# Patient Record
Sex: Male | Born: 1985 | Race: Black or African American | Hispanic: No | Marital: Single | Smoking: Never smoker
Health system: Southern US, Community
[De-identification: ages and names within clinical notes are randomized; demographics above are authoritative.]

---

## 2020-04-22 ENCOUNTER — Encounter (HOSPITAL_COMMUNITY): Payer: Self-pay

## 2020-04-22 ENCOUNTER — Other Ambulatory Visit: Payer: Self-pay

## 2020-04-22 ENCOUNTER — Ambulatory Visit (HOSPITAL_COMMUNITY)
Admission: EM | Admit: 2020-04-22 | Discharge: 2020-04-22 | Disposition: A | Discharge status: Home / Self Care | Payer: Self-pay | Attending: Family Medicine | Admitting: Family Medicine

## 2020-04-22 ENCOUNTER — Emergency Department (HOSPITAL_COMMUNITY): Payer: Self-pay

## 2020-04-22 ENCOUNTER — Emergency Department (HOSPITAL_COMMUNITY)
Admission: EM | Admit: 2020-04-22 | Discharge: 2020-04-22 | Disposition: A | Discharge status: Home / Self Care | Payer: Self-pay | Attending: Emergency Medicine | Admitting: Emergency Medicine

## 2020-04-22 DIAGNOSIS — R519 Headache, unspecified: Secondary | ICD-10-CM

## 2020-04-22 LAB — CBC WITH DIFFERENTIAL/PLATELET
Abs Immature Granulocytes: 0.02 10*3/uL (ref 0.00–0.07)
Basophils Absolute: 0 10*3/uL (ref 0.0–0.1)
Basophils Relative: 1 %
Eosinophils Absolute: 0.4 10*3/uL (ref 0.0–0.5)
Eosinophils Relative: 8 %
HCT: 46.3 % (ref 39.0–52.0)
Hemoglobin: 14.8 g/dL (ref 13.0–17.0)
Immature Granulocytes: 0 %
Lymphocytes Relative: 44 %
Lymphs Abs: 2.2 10*3/uL (ref 0.7–4.0)
MCH: 28.5 pg (ref 26.0–34.0)
MCHC: 32 g/dL (ref 30.0–36.0)
MCV: 89.2 fL (ref 80.0–100.0)
Monocytes Absolute: 0.4 10*3/uL (ref 0.1–1.0)
Monocytes Relative: 8 %
Neutro Abs: 2 10*3/uL (ref 1.7–7.7)
Neutrophils Relative %: 39 %
Platelets: 203 10*3/uL (ref 150–400)
RBC: 5.19 MIL/uL (ref 4.22–5.81)
RDW: 12.5 % (ref 11.5–15.5)
WBC: 5 10*3/uL (ref 4.0–10.5)
nRBC: 0 % (ref 0.0–0.2)

## 2020-04-22 LAB — COMPREHENSIVE METABOLIC PANEL
ALT: 20 U/L (ref 0–44)
AST: 20 U/L (ref 15–41)
Albumin: 3.9 g/dL (ref 3.5–5.0)
Alkaline Phosphatase: 77 U/L (ref 38–126)
Anion gap: 8 (ref 5–15)
BUN: 8 mg/dL (ref 6–20)
CO2: 27 mmol/L (ref 22–32)
Calcium: 9.3 mg/dL (ref 8.9–10.3)
Chloride: 104 mmol/L (ref 98–111)
Creatinine, Ser: 1.2 mg/dL (ref 0.61–1.24)
GFR, Estimated: >60 mL/min (ref >60)
Glucose, Bld: 96 mg/dL (ref 70–99)
Potassium: 4.2 mmol/L (ref 3.5–5.1)
Sodium: 139 mmol/L (ref 135–145)
Total Bilirubin: 1 mg/dL (ref 0.3–1.2)
Total Protein: 6.9 g/dL (ref 6.5–8.1)

## 2020-04-22 MED ORDER — GADOBUTROL 1 MMOL/ML IV SOLN
10.0000 mL | Freq: Once | INTRAVENOUS | Status: AC | PRN
  Administered 2020-04-22: 10 mL via INTRAVENOUS

## 2020-04-22 MED ORDER — DIPHENHYDRAMINE HCL 50 MG/ML IJ SOLN
25.0000 mg | Freq: Once | INTRAMUSCULAR | Status: AC
  Administered 2020-04-22: 25 mg via INTRAVENOUS
  Filled 2020-04-22: qty 1

## 2020-04-22 MED ORDER — PROCHLORPERAZINE EDISYLATE 10 MG/2ML IJ SOLN
10.0000 mg | Freq: Once | INTRAMUSCULAR | Status: AC
  Administered 2020-04-22: 10 mg via INTRAVENOUS
  Filled 2020-04-22: qty 2

## 2020-04-22 MED ORDER — SODIUM CHLORIDE 0.9 % IV BOLUS
1000.0000 mL | Freq: Once | INTRAVENOUS | Status: AC
  Administered 2020-04-22: 1000 mL via INTRAVENOUS

## 2020-04-22 MED ORDER — PREDNISONE 20 MG PO TABS: 40.0000 mg | ORAL_TABLET | Freq: Every day | ORAL | 0 refills | Status: AC

## 2020-04-22 NOTE — ED Triage Notes (Signed)
Pt c/o HA for approx 3 weeks that originated on left temporal area and progressed to involve right temporal area as well. Reports sensitivity to noise as well. Pt states the HA has improved the past week that he attributes to increased opportunity for sleep/rest Last took ibuprofen yesterday with some improvement of symptoms.   States he felt his left side was slightly "weaker" than right approx 3 weeks ago at onset of HA.  Gross neuro intact, smile symmetrical, grips equal/strong, negative for arm drift.  Denies dizziness, CP,  extremity weakness/parassthesias, slurred speech, change in vision, n/v, sensitivity to light, neck pain/rigidity, fever, chills, congestion or other URI, ear pain.   States he had similar prolonged HA in February that pt attributed long hours of 160 hours/week and loss of sleep. Reports he is here today b/c his girlfriend insisted pt be evaluated 2/2 her family h/o neuro/brain dz.

## 2020-04-22 NOTE — ED Notes (Signed)
MRI called, transport on way to bring to MRI

## 2020-04-22 NOTE — ED Provider Notes (Signed)
[facility] EMERGENCY DEPARTMENT Provider Note   CSN: [id number] Arrival date & time: 04/22/20  1313     History Chief Complaint  Patient presents with  . Headache    Kenneth Silva is a 34 y.o. male who presents for evaluation of headache that has been ongoing for about 3 weeks.  He states that he noticed a headache about 3 weeks ago.  He states that he was leaving his girlfriend's house and getting ready to go to work when it started.  He states it has been on the left.  He states that since then, it has been intermittently occurring.  He feels like it gets better when he rests and goes to sleep and then when he starts moving around again, he feels like the headache starts.  He states when it gets worse, it radiates from the left to the right side.  He states that he will take ibuprofen and it somewhat dulls it but then it comes back.  He describes it as a throbbing headache.  He states he is sensitive to sounds but does not have any photophobia.  He has not noticed any associated blurry vision.  He denies any preceding trauma, injury.  He is not on blood thinners.  He does state that he feels like he has had some weakness and decrease sensation noted to his entire left side.  He describes it as "feeling like he's losing steam." He states that he feels like this is been ongoing for some time but has difficulty bearing down exactly when this has occurred.  He states that he can feel his left side but he states that when things touch it, it feels different.  He has no family history of MS or neuro abnormalities.  He states he had some similar episodes of headache in January which he states improved after he got rest and started sleeping well.  He went to urgent care today for evaluation of symptoms and was brought to the emergency department.    The history is provided by the patient.       No past medical history on file.  There are no problems to display for this  patient.   No past surgical history on file.     Family History  Problem Relation Age of Onset  . Healthy Mother     Social History   Tobacco Use  . Smoking status: Never Smoker  . Smokeless tobacco: Never Used  Vaping Use  . Vaping Use: Never used  Substance Use Topics  . Alcohol use: Never  . Drug use: Never    Home Medications Prior to Admission medications   Medication Sig Start Date End Date Taking? Authorizing Provider  ibuprofen (ADVIL) 200 MG tablet Take 400 mg by mouth 2 (two) times daily as needed for headache.   Yes [provider]  Tetrahydrozoline HCl (VISINE OP) Place 1 drop into both eyes daily as needed (prior to driving).   Yes [provider]  predniSONE (DELTASONE) 20 MG tablet Take 2 tablets (40 mg total) by mouth daily for 4 days. 04/22/20 04/26/20  Maxwell Caul, PA-C    Allergies    Patient has no known allergies.  Review of Systems   Review of Systems  Constitutional: Negative for fever.  Eyes: Negative for visual disturbance.  Respiratory: Negative for cough and shortness of breath.   Cardiovascular: Negative for chest pain.  Gastrointestinal: Negative for abdominal pain, nausea and vomiting.  Genitourinary: Negative  for dysuria and hematuria.  Neurological: Positive for weakness, numbness and headaches.  All other systems reviewed and are negative.   Physical Exam Updated Vital Signs BP 119/77 (BP Location: Right Arm)   Pulse 80   Temp 98 F (36.7 C) (Oral)   Resp 18   Ht 5\' 7"  (1.702 m)   Wt 94.3 kg   SpO2 100%   BMI 32.58 kg/m   Physical Exam Vitals and nursing note reviewed.  Constitutional:      Appearance: Normal appearance. He is well-developed.  HENT:     Head: Normocephalic and atraumatic.     Comments: No tenderness to palpation of skull. No deformities or crepitus noted. No open wounds, abrasions or lacerations.  Eyes:     General: Lids are normal.     Conjunctiva/sclera: Conjunctivae normal.      Pupils: Pupils are equal, round, and reactive to light.     Comments: PERRL. EOMs intact. No nystagmus. No neglect.   Neck:     Comments: Full flexion/extension and lateral movement of neck fully intact. No bony midline tenderness. No deformities or crepitus.  No rigidity, supple.  Cardiovascular:     Rate and Rhythm: Normal rate and regular rhythm.     Pulses: Normal pulses.          Radial pulses are 2+ on the right side and 2+ on the left side.       Dorsalis pedis pulses are 2+ on the right side and 2+ on the left side.     Heart sounds: Normal heart sounds. No murmur heard.  No friction rub. No gallop.   Pulmonary:     Effort: Pulmonary effort is normal.     Breath sounds: Normal breath sounds.     Comments: Lungs clear to auscultation bilaterally.  Symmetric chest rise.  No wheezing, rales, rhonchi. Abdominal:     Palpations: Abdomen is soft. Abdomen is not rigid.     Tenderness: There is no abdominal tenderness. There is no guarding.     Comments: Abdomen is soft, non-distended, non-tender. No rigidity, No guarding. No peritoneal signs.  Musculoskeletal:        General: Normal range of motion.     Cervical back: Full passive range of motion without pain.  Skin:    General: Skin is warm and dry.     Capillary Refill: Capillary refill takes less than 2 seconds.  Neurological:     Mental Status: He is alert and oriented to person, place, and time.     Comments: Cranial nerves III-XII intact Follows commands, Moves all extremities  5/5 strength to BUE and BLE  He reports decreased sensation noted to the V1 and V3 distribution of the left face as well as LUE and LLE.  Normal finger to nose. No dysdiadochokinesia. On evaluation of drift, he does have some left sided drift but does not pronate  No gait abnormalities  No slurred speech. No facial droop.   Psychiatric:        Speech: Speech normal.     ED Results / Procedures / Treatments   Labs (all labs ordered are  listed, but only abnormal results are displayed) Labs Reviewed  CBC WITH DIFFERENTIAL/PLATELET  COMPREHENSIVE METABOLIC PANEL    EKG None  Radiology CT Head Wo Contrast  Result Date: 04/22/2020 CLINICAL DATA:  Headache EXAM: CT HEAD WITHOUT CONTRAST TECHNIQUE: Contiguous axial images were obtained from the base of the skull through the vertex without intravenous contrast. COMPARISON:  None. FINDINGS: Brain: No evidence of acute infarction, hemorrhage, hydrocephalus, extra-axial collection or mass lesion/mass effect. Vascular: No hyperdense vessel or unexpected calcification. Skull: Normal. Negative for fracture or focal lesion. Sinuses/Orbits: No acute finding. Other: None. IMPRESSION: No acute intracranial abnormality. Electronically Signed   By: Meda KlinefelterStephanie  Peacock MD   On: 04/22/2020 17:50   MR Brain W and Wo Contrast  Result Date: 04/22/2020 CLINICAL DATA:  Headache for 3 weeks. Neuro deficit, acute, stroke suspected. Paresthesias. EXAM: MRI HEAD WITHOUT AND WITH CONTRAST TECHNIQUE: Multiplanar, multiecho pulse sequences of the brain and surrounding structures were obtained without and with intravenous contrast. CONTRAST:  10 mL Gadavist COMPARISON:  CT head without contrast 04/22/2020 FINDINGS: Brain: No acute infarct, hemorrhage, or mass lesion is present. No significant white matter lesions are present. The ventricles are of normal size. No significant extraaxial fluid collection is present. The brainstem and cerebellum are within normal limits. The internal auditory canals are within normal limits. Postcontrast images demonstrate no pathologic enhancement. Vascular: Flow is present in the major intracranial arteries. Skull and upper cervical spine: The craniocervical junction is normal. Upper cervical spine is within normal limits. Marrow signal is unremarkable. Sinuses/Orbits: Polyp or mucous retention cyst is noted in the inferior left maxillary sinus. Mild mucosal thickening is present  throughout the ethmoid air cells. Remaining paranasal sinuses and mastoid air cells are clear. The globes and orbits are within normal limits. IMPRESSION: 1. Normal MRI appearance of the brain. No acute or focal lesion to explain the patient's symptoms. 2. Mild sinus disease as described. Electronically Signed   By: Marin Robertshristopher  Mattern M.D.   On: 04/22/2020 21:58   MR Cervical Spine W or Wo Contrast  Result Date: 04/22/2020 CLINICAL DATA:  Paresthesias.  Numbness or tingling.  Headaches. EXAM: MRI CERVICAL SPINE WITHOUT AND WITH CONTRAST TECHNIQUE: Multiplanar and multiecho pulse sequences of the cervical spine, to include the craniocervical junction and cervicothoracic junction, were obtained without and with intravenous contrast. CONTRAST:  10mL GADAVIST GADOBUTROL 1 MMOL/ML IV SOLN COMPARISON:  None. FINDINGS: Alignment: No significant listhesis is present. There is some straightening of the normal lumbar lordosis. Vertebrae: Marrow signal and vertebral body heights are normal. Cord: Normal signal and morphology. Posterior Fossa, vertebral arteries, paraspinal tissues: Craniocervical junction is normal. Flow is present in the vertebral arteries bilaterally. Visualized intracranial contents are normal. Disc levels: C2-3: Mild right-sided uncovertebral spurring and mild right foraminal narrowing is present. The central canal is patent. C3-4: Mild uncovertebral spurring is present. No significant stenosis is present. C4-5: Negative. C5-6: Negative. C6-7: Negative. C7-T1: Negative IMPRESSION: 1. Mild right-sided uncovertebral spurring at C2-3 with mild right foraminal narrowing. 2. Mild uncovertebral spurring at C3-4 without significant stenosis. 3. No acute abnormality or significant stenosis to explain the patient's paresthesias. Electronically Signed   By: Marin Robertshristopher  Mattern M.D.   On: 04/22/2020 22:03    Procedures Procedures (including critical care time)  Medications Ordered in ED Medications    sodium chloride 0.9 % bolus 1,000 mL (0 mLs Intravenous Stopped 04/22/20 1753)  sodium chloride 0.9 % bolus 1,000 mL (0 mLs Intravenous Stopped 04/22/20 2239)  prochlorperazine (COMPAZINE) injection 10 mg (10 mg Intravenous Given 04/22/20 1849)  diphenhydrAMINE (BENADRYL) injection 25 mg (25 mg Intravenous Given 04/22/20 1849)  gadobutrol (GADAVIST) 1 MMOL/ML injection 10 mL (10 mLs Intravenous Contrast Given 04/22/20 2152)    ED Course  I have reviewed the triage vital signs and the nursing notes.  Pertinent labs & imaging results that were available during my  care of the patient were reviewed by me and considered in my medical decision making (see chart for details).    MDM Rules/Calculators/A&P                          34 year old male who presents for evaluation of headache x3 weeks.  He reports it has been intermittently occurring.  He states he has not noted any fevers.  He reports some sensitivity sound but no sensitivity to light.  He also feels like he has been weak in his left side for several months.  He states this has been before the headache.  He states he feels like he "loses steam" and he feels like he has some numbness in his left side.  On initial ED arrival, he is afebrile nontoxic-appearing.  Vital signs are stable.  On exam, decreased sensation in V1 and V3 facial distribution on the left side.  No evidence of facial droop or weakness.  He also reports decreased sensation noted left upper extremity and left lower extremity.  I do not appreciate any weakness on his left upper extremity but when he I do evaluate drift, he does drift on the left upper extremity but does not pronate.  No weakness noted of left lower extremity.  Check labs, CT head.  CBC shows no leukocytosis or anemia.  CMP shows normal BUN creatinine.  CT head negative for any acute abnormalities.  Discussed patient with Dr. Silverio Lay regarding patient's exam.  Patient has no focal weakness of either his left upper or  lower extremity.  The only thing he notices when a test for pronator drift, his left lower extremity drift slightly but no pronation.  We will plan for MRI brain and C-spine with and without contrast for evaluation of any acute abnormality.  MRI brain reviewed.  Normal MRI.  No acute or focal lesion noted.  MRI cervical spine shows mild right-sided uncovertebral spurring at C2-C3 with mild right foraminal narrowing.  No findings on the left side.  Discussed Dr. Silverio Lay.  We will plan to send patient home a short course of steroids.  Will plan for outpatient neuro.  Discussed with patient and significant other.  He reports headache improved after migraine cocktail here in the ED.  I discussed with patient regarding today's findings.  I discussed with him that the next step would plan for would be outpatient neurology follow-up for his symptoms. At this time, patient exhibits no emergent life-threatening condition that require further evaluation in ED. Patient is ambulatory in the ED. Patient had ample opportunity for questions and discussion. All patient's questions were answered with full understanding. Strict return precautions discussed. Patient expresses understanding and agreement to plan.   Portions of this note were generated with Scientist, clinical (histocompatibility and immunogenetics). Dictation errors may occur despite best attempts at proofreading.  Final Clinical Impression(s) / ED Diagnoses Final diagnoses:  Acute nonintractable headache, unspecified headache type    Rx / DC Orders ED Discharge Orders         Ordered    Ambulatory referral to Neurology       Comments: An appointment is requested in approximately: 2 weeks   04/22/20 2217    predniSONE (DELTASONE) 20 MG tablet  Daily        04/22/20 2218           Maxwell Caul, PA-C 04/22/20 2248    Charlynne Pander, MD 04/22/20 [id number]

## 2020-04-22 NOTE — Discharge Instructions (Signed)
As we discussed, your work-up today was reassuring.  The next step is to follow-up with outpatient neurology.  I have given you a referral to them.  If you have not heard from the next week, call their office.  Take prednisone as directed.  Return the emergency department for any worsening headache, difficulty walking, difficulty moving your arms or legs, vision changes, fever or any other worsening concerning symptoms.

## 2020-04-22 NOTE — ED Notes (Signed)
Patient verbalizes understanding of discharge instructions. Opportunity for questioning and answers were provided. Armband removed by staff, pt discharged from ED to home 

## 2020-04-22 NOTE — ED Notes (Signed)
Patient is being discharged from the Urgent Care and sent to the Emergency Department via POV Per Dr. Tracie Harrier, patient is in need of higher level of care due to CT head 2/2 refractory HA. Patient is aware and verbalizes understanding of plan of care.  Vitals:   04/22/20 1242  BP: 121/82  Pulse: 73  Resp: 15  Temp: 98.8 F (37.1 C)  SpO2: 100%

## 2020-04-22 NOTE — ED Triage Notes (Signed)
Pt here from [facility] for further eval of headache x 3 weeks. Taking ibuprofen with minimal relief. Had headache of same severity/duration in January, which resolved after sleep.

## 2020-04-24 NOTE — ED Provider Notes (Signed)
[facility] CARE [facility]ENTER   [id number] 04/22/20 Arrival Time: 1112  ASSESSMENT & PLAN:  1. New onset of headaches      Discussed limited workup available here for new onset headaches. He prefers ED evaluation.  Normal neurological exam here.   Reviewed expectations re: course of current medical issues. Questions answered. Outlined signs and symptoms indicating need for more acute intervention. Patient verbalized understanding. After Visit Summary given.   SUBJECTIVE: History from: Patient Patient is able to give a clear and coherent history.  Kenneth Silva is a 34 y.o. male who presents with headache for 3 weeks. No head injury. More during day. Throbbing. Without n/v/visual or hearing changes. Better when he rests. Does not wake him at night. Ambulatory without difficulty. No extremity sensation changes or weakness.    OBJECTIVE:  Vitals:   04/22/20 1242  BP: 121/82  Pulse: 73  Resp: 15  Temp: 98.8 F (37.1 C)  TempSrc: Oral  SpO2: 100%    General appearance: alert; NAD HENT: normocephalic; atraumatic Eyes: PERRLA; EOMI; conjunctivae normal Neck: supple with FROM Lungs: clear to auscultation bilaterally; unlabored respirations Heart: regular rate and rhythm Extremities: no edema; symmetrical with no gross deformities Skin: warm and dry Neurologic: alert; speech is fluent and clear without dysarthria or aphasia; CN 2-12 grossly intact Psychological: alert and cooperative; normal mood and affect   No Known Allergies  History reviewed. No pertinent past medical history. Social History   Socioeconomic History  . Marital status: Single    Spouse name: Not on file  . Number of children: Not on file  . Years of education: Not on file  . Highest education level: Not on file  Occupational History  . Not on file  Tobacco Use  . Smoking status: Never Smoker  . Smokeless tobacco: Never Used  Vaping Use  . Vaping Use: Never used  Substance and Sexual  Activity  . Alcohol use: Never  . Drug use: Never  . Sexual activity: Never  Other Topics Concern  . Not on file  Social History Narrative  . Not on file   Social Determinants of Health   Financial Resource Strain:   . Difficulty of Paying Living Expenses: Not on file  Food Insecurity:   . Worried About Programme researcher, broadcasting/film/video in the Last Year: Not on file  . Ran Out of Food in the Last Year: Not on file  Transportation Needs:   . Lack of Transportation (Medical): Not on file  . Lack of Transportation (Non-Medical): Not on file  Physical Activity:   . Days of Exercise per Week: Not on file  . Minutes of Exercise per Session: Not on file  Stress:   . Feeling of Stress : Not on file  Social Connections:   . Frequency of Communication with Friends and Family: Not on file  . Frequency of Social Gatherings with Friends and Family: Not on file  . Attends Religious Services: Not on file  . Active Member of Clubs or Organizations: Not on file  . Attends Banker Meetings: Not on file  . Marital Status: Not on file  Intimate Partner Violence:   . Fear of Current or Ex-Partner: Not on file  . Emotionally Abused: Not on file  . Physically Abused: Not on file  . Sexually Abused: Not on file   Family History  Problem Relation Age of Onset  . Healthy Mother    History reviewed. No pertinent surgical history.   Mardella Layman, MD 04/24/20  0835  

## 2021-03-20 ENCOUNTER — Emergency Department (HOSPITAL_BASED_OUTPATIENT_CLINIC_OR_DEPARTMENT_OTHER): Payer: No Typology Code available for payment source

## 2021-03-20 ENCOUNTER — Emergency Department (HOSPITAL_BASED_OUTPATIENT_CLINIC_OR_DEPARTMENT_OTHER)
Admission: EM | Admit: 2021-03-20 | Discharge: 2021-03-20 | Disposition: A | Discharge status: Home / Self Care | Payer: No Typology Code available for payment source | Attending: Emergency Medicine | Admitting: Emergency Medicine

## 2021-03-20 ENCOUNTER — Encounter (HOSPITAL_BASED_OUTPATIENT_CLINIC_OR_DEPARTMENT_OTHER): Payer: Self-pay | Admitting: *Deleted

## 2021-03-20 ENCOUNTER — Other Ambulatory Visit: Payer: Self-pay

## 2021-03-20 DIAGNOSIS — Y92410 Unspecified street and highway as the place of occurrence of the external cause: Secondary | ICD-10-CM | POA: Insufficient documentation

## 2021-03-20 DIAGNOSIS — M542 Cervicalgia: Secondary | ICD-10-CM | POA: Diagnosis not present

## 2021-03-20 DIAGNOSIS — R519 Headache, unspecified: Secondary | ICD-10-CM | POA: Diagnosis not present

## 2021-03-20 MED ORDER — IBUPROFEN 800 MG PO TABS
800.0000 mg | ORAL_TABLET | Freq: Once | ORAL | Status: AC
  Administered 2021-03-20: 800 mg via ORAL
  Filled 2021-03-20: qty 1
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
## 2021-03-20 NOTE — Discharge Instructions (Addendum)
Please return to the emergency department if you begin to have a worsening headache, visual changes, difficulty breathing or chest pain.  You may utilize over-the-counter ibuprofen for your pain.  Call the number on your discharge papers to establish yourself with a primary care provider who may follow-up if you have other problems.  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   I hope that you feel better.
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
